# Patient Record
Sex: Female | Born: 1960 | Race: White | Hispanic: No | Marital: Married | Smoking: Former smoker
Health system: Southern US, Community
[De-identification: ages and names within clinical notes are randomized; demographics above are authoritative.]

## PROBLEM LIST (undated history)

## (undated) DIAGNOSIS — J329 Chronic sinusitis, unspecified: Secondary | ICD-10-CM

## (undated) DIAGNOSIS — M76899 Other specified enthesopathies of unspecified lower limb, excluding foot: Secondary | ICD-10-CM

## (undated) DIAGNOSIS — N6019 Diffuse cystic mastopathy of unspecified breast: Secondary | ICD-10-CM

## (undated) DIAGNOSIS — IMO0002 Reserved for concepts with insufficient information to code with codable children: Secondary | ICD-10-CM

## (undated) DIAGNOSIS — M51369 Other intervertebral disc degeneration, lumbar region without mention of lumbar back pain or lower extremity pain: Secondary | ICD-10-CM

## (undated) DIAGNOSIS — F419 Anxiety disorder, unspecified: Secondary | ICD-10-CM

## (undated) DIAGNOSIS — F329 Major depressive disorder, single episode, unspecified: Secondary | ICD-10-CM

## (undated) DIAGNOSIS — N951 Menopausal and female climacteric states: Secondary | ICD-10-CM

## (undated) DIAGNOSIS — M5136 Other intervertebral disc degeneration, lumbar region: Secondary | ICD-10-CM

## (undated) DIAGNOSIS — F32A Depression, unspecified: Secondary | ICD-10-CM

## (undated) DIAGNOSIS — T7840XA Allergy, unspecified, initial encounter: Secondary | ICD-10-CM

## (undated) DIAGNOSIS — F410 Panic disorder [episodic paroxysmal anxiety] without agoraphobia: Secondary | ICD-10-CM

## (undated) DIAGNOSIS — E785 Hyperlipidemia, unspecified: Secondary | ICD-10-CM

## (undated) DIAGNOSIS — M1990 Unspecified osteoarthritis, unspecified site: Secondary | ICD-10-CM

## (undated) DIAGNOSIS — G43909 Migraine, unspecified, not intractable, without status migrainosus: Secondary | ICD-10-CM

## (undated) DIAGNOSIS — K219 Gastro-esophageal reflux disease without esophagitis: Secondary | ICD-10-CM

## (undated) DIAGNOSIS — M19049 Primary osteoarthritis, unspecified hand: Secondary | ICD-10-CM

## (undated) DIAGNOSIS — C801 Malignant (primary) neoplasm, unspecified: Secondary | ICD-10-CM

## (undated) DIAGNOSIS — M5030 Other cervical disc degeneration, unspecified cervical region: Secondary | ICD-10-CM

## (undated) DIAGNOSIS — G8929 Other chronic pain: Secondary | ICD-10-CM

## (undated) DIAGNOSIS — G5600 Carpal tunnel syndrome, unspecified upper limb: Secondary | ICD-10-CM

## (undated) DIAGNOSIS — T23269A Burn of second degree of back of unspecified hand, initial encounter: Secondary | ICD-10-CM

## (undated) HISTORY — DX: Unspecified osteoarthritis, unspecified site: M19.90

## (undated) HISTORY — DX: Panic disorder (episodic paroxysmal anxiety): F41.0

## (undated) HISTORY — DX: Carpal tunnel syndrome, unspecified upper limb: G56.00

## (undated) HISTORY — DX: Anxiety disorder, unspecified: F41.9

## (undated) HISTORY — DX: Migraine, unspecified, not intractable, without status migrainosus: G43.909

## (undated) HISTORY — DX: Other intervertebral disc degeneration, lumbar region without mention of lumbar back pain or lower extremity pain: M51.369

## (undated) HISTORY — DX: Other specified enthesopathies of unspecified lower limb, excluding foot: M76.899

## (undated) HISTORY — DX: Major depressive disorder, single episode, unspecified: F32.9

## (undated) HISTORY — DX: Burn of second degree of back of unspecified hand, initial encounter: T23.269A

## (undated) HISTORY — DX: Other chronic pain: G89.29

## (undated) HISTORY — DX: Other intervertebral disc degeneration, lumbar region: M51.36

## (undated) HISTORY — PX: OTHER SURGICAL HISTORY: SHX169

## (undated) HISTORY — DX: Depression, unspecified: F32.A

## (undated) HISTORY — PX: ABDOMINAL HYSTERECTOMY: SHX81

## (undated) HISTORY — DX: Other cervical disc degeneration, unspecified cervical region: M50.30

## (undated) HISTORY — DX: Malignant (primary) neoplasm, unspecified: C80.1

## (undated) HISTORY — DX: Gastro-esophageal reflux disease without esophagitis: K21.9

## (undated) HISTORY — DX: Allergy, unspecified, initial encounter: T78.40XA

## (undated) HISTORY — DX: Primary osteoarthritis, unspecified hand: M19.049

## (undated) HISTORY — DX: Hyperlipidemia, unspecified: E78.5

## (undated) HISTORY — DX: Diffuse cystic mastopathy of unspecified breast: N60.19

## (undated) HISTORY — DX: Menopausal and female climacteric states: N95.1

## (undated) HISTORY — DX: Reserved for concepts with insufficient information to code with codable children: IMO0002

## (undated) HISTORY — DX: Chronic sinusitis, unspecified: J32.9

---

## 2006-11-04 ENCOUNTER — Ambulatory Visit: Payer: Self-pay

## 2007-06-05 ENCOUNTER — Other Ambulatory Visit: Payer: Self-pay

## 2007-06-05 ENCOUNTER — Emergency Department: Payer: Self-pay | Admitting: Emergency Medicine

## 2007-06-16 ENCOUNTER — Ambulatory Visit: Payer: Self-pay

## 2007-07-07 ENCOUNTER — Encounter: Payer: Self-pay | Admitting: Specialist

## 2007-07-15 ENCOUNTER — Encounter: Payer: Self-pay | Admitting: Specialist

## 2007-08-12 ENCOUNTER — Ambulatory Visit: Payer: Self-pay | Admitting: Specialist

## 2007-08-15 ENCOUNTER — Encounter: Payer: Self-pay | Admitting: Specialist

## 2007-09-15 ENCOUNTER — Encounter: Payer: Self-pay | Admitting: Specialist

## 2008-08-24 ENCOUNTER — Ambulatory Visit: Payer: Self-pay | Admitting: Family Medicine

## 2009-10-25 ENCOUNTER — Ambulatory Visit: Payer: Self-pay | Admitting: Family Medicine

## 2010-01-14 HISTORY — PX: SKIN GRAFT: SHX250

## 2010-01-14 HISTORY — PX: ESCHAROTOMY: SHX248

## 2011-09-20 ENCOUNTER — Ambulatory Visit (INDEPENDENT_AMBULATORY_CARE_PROVIDER_SITE_OTHER): Payer: BC Managed Care – PPO | Admitting: Family Medicine

## 2011-09-20 ENCOUNTER — Ambulatory Visit: Payer: BC Managed Care – PPO

## 2011-09-20 VITALS — BP 126/88 | HR 85 | Temp 98.3°F | Resp 16 | Ht 62.75 in | Wt 201.8 lb

## 2011-09-20 DIAGNOSIS — K219 Gastro-esophageal reflux disease without esophagitis: Secondary | ICD-10-CM

## 2011-09-20 DIAGNOSIS — Z23 Encounter for immunization: Secondary | ICD-10-CM

## 2011-09-20 DIAGNOSIS — R202 Paresthesia of skin: Secondary | ICD-10-CM

## 2011-09-20 DIAGNOSIS — M5136 Other intervertebral disc degeneration, lumbar region: Secondary | ICD-10-CM

## 2011-09-20 DIAGNOSIS — R0602 Shortness of breath: Secondary | ICD-10-CM

## 2011-09-20 DIAGNOSIS — G894 Chronic pain syndrome: Secondary | ICD-10-CM | POA: Insufficient documentation

## 2011-09-20 DIAGNOSIS — F419 Anxiety disorder, unspecified: Secondary | ICD-10-CM | POA: Insufficient documentation

## 2011-09-20 DIAGNOSIS — G5603 Carpal tunnel syndrome, bilateral upper limbs: Secondary | ICD-10-CM | POA: Insufficient documentation

## 2011-09-20 DIAGNOSIS — E785 Hyperlipidemia, unspecified: Secondary | ICD-10-CM | POA: Insufficient documentation

## 2011-09-20 DIAGNOSIS — F411 Generalized anxiety disorder: Secondary | ICD-10-CM

## 2011-09-20 DIAGNOSIS — M5030 Other cervical disc degeneration, unspecified cervical region: Secondary | ICD-10-CM

## 2011-09-20 DIAGNOSIS — M5137 Other intervertebral disc degeneration, lumbosacral region: Secondary | ICD-10-CM

## 2011-09-20 DIAGNOSIS — M51369 Other intervertebral disc degeneration, lumbar region without mention of lumbar back pain or lower extremity pain: Secondary | ICD-10-CM | POA: Insufficient documentation

## 2011-09-20 LAB — COMPREHENSIVE METABOLIC PANEL
Albumin: 4.3 g/dL (ref 3.5–5.2)
CO2: 29 mEq/L (ref 19–32)
Glucose, Bld: 86 mg/dL (ref 70–99)
Potassium: 4.2 mEq/L (ref 3.5–5.3)
Sodium: 140 mEq/L (ref 135–145)
Total Bilirubin: 0.2 mg/dL — ABNORMAL LOW (ref 0.3–1.2)
Total Protein: 6.9 g/dL (ref 6.0–8.3)

## 2011-09-20 LAB — POCT CBC
HCT, POC: 39 % (ref 37.7–47.9)
Hemoglobin: 12.3 g/dL (ref 12.2–16.2)
Lymph, poc: 3.1 (ref 0.6–3.4)
MCHC: 31.5 g/dL — AB (ref 31.8–35.4)
POC Granulocyte: 3.9 (ref 2–6.9)

## 2011-09-20 LAB — TSH: TSH: 1.923 u[IU]/mL (ref 0.350–4.500)

## 2011-09-20 MED ORDER — OXYCODONE HCL 5 MG PO TABS: 5.0000 mg | ORAL_TABLET | Freq: Three times a day (TID) | ORAL | Status: DC | PRN

## 2011-09-20 MED ORDER — ALPRAZOLAM 1 MG PO TABS: 1.0000 mg | ORAL_TABLET | Freq: Three times a day (TID) | ORAL | Status: DC | PRN

## 2011-09-20 MED ORDER — OXYCODONE HCL 5 MG PO TABS: 5.0000 mg | ORAL_TABLET | Freq: Three times a day (TID) | ORAL | Status: AC | PRN

## 2011-09-20 MED ORDER — OMEPRAZOLE 40 MG PO CPDR: 40.0000 mg | DELAYED_RELEASE_CAPSULE | Freq: Every day | ORAL | Status: DC
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
## 2011-09-20 NOTE — Patient Instructions (Addendum)
1. Degenerative disc disease, cervical    2. Lumbar degenerative disc disease    3. Paresthesias in right hand  POCT CBC, Comprehensive metabolic panel, TSH  4. GERD (gastroesophageal reflux disease)    5. Anxiety    6. Hyperlipidemia    7. Shortness of breath  EKG 12-Lead, DG Chest 2 View     FOLLOW UP IN 3 MONTHS.

## 2011-09-21 NOTE — Progress Notes (Signed)
Reviewed and agree.

## 2011-09-23 ENCOUNTER — Telehealth: Payer: Self-pay

## 2011-09-23 MED ORDER — OMEPRAZOLE 40 MG PO CPDR: 40.0000 mg | DELAYED_RELEASE_CAPSULE | Freq: Every day | ORAL | Status: DC

## 2011-09-23 NOTE — Telephone Encounter (Signed)
nexium is $50 at [facility], she wants sent to [facility] instead will be cheaper, done.

## 2011-09-23 NOTE — Telephone Encounter (Signed)
Pt would like her acid meds called into [facility] in [place], [place] Dr.. Katrinka Blazing

## 2011-09-30 HISTORY — PX: OTHER SURGICAL HISTORY: SHX169

## 2011-10-01 ENCOUNTER — Telehealth: Payer: Self-pay

## 2011-10-01 MED ORDER — OMEPRAZOLE 40 MG PO CPDR: 40.0000 mg | DELAYED_RELEASE_CAPSULE | Freq: Every day | ORAL | Status: DC

## 2011-10-01 NOTE — Telephone Encounter (Signed)
rx sent in again. lmom that rx was sent in

## 2011-10-01 NOTE — Telephone Encounter (Signed)
Pt states she has been trying to get rx for generic prilosec filled, but [facility] @ [address], states has not been sent in by [facility] . Please call pt to advise.

## 2011-10-14 ENCOUNTER — Ambulatory Visit: Payer: Self-pay | Admitting: Family Medicine

## 2011-11-15 HISTORY — PX: OTHER SURGICAL HISTORY: SHX169

## 2011-12-05 ENCOUNTER — Encounter: Payer: Self-pay | Admitting: *Deleted

## 2011-12-24 ENCOUNTER — Encounter: Payer: Self-pay | Admitting: Family Medicine

## 2011-12-24 ENCOUNTER — Ambulatory Visit (INDEPENDENT_AMBULATORY_CARE_PROVIDER_SITE_OTHER): Payer: BC Managed Care – PPO | Admitting: Family Medicine

## 2011-12-24 VITALS — BP 113/65 | HR 87 | Temp 98.8°F | Resp 16 | Ht 63.5 in | Wt 208.0 lb

## 2011-12-24 DIAGNOSIS — G894 Chronic pain syndrome: Secondary | ICD-10-CM

## 2011-12-24 DIAGNOSIS — F329 Major depressive disorder, single episode, unspecified: Secondary | ICD-10-CM

## 2011-12-24 DIAGNOSIS — M5136 Other intervertebral disc degeneration, lumbar region: Secondary | ICD-10-CM

## 2011-12-24 DIAGNOSIS — M51369 Other intervertebral disc degeneration, lumbar region without mention of lumbar back pain or lower extremity pain: Secondary | ICD-10-CM

## 2011-12-24 DIAGNOSIS — M5137 Other intervertebral disc degeneration, lumbosacral region: Secondary | ICD-10-CM

## 2011-12-24 DIAGNOSIS — F419 Anxiety disorder, unspecified: Secondary | ICD-10-CM | POA: Insufficient documentation

## 2011-12-24 DIAGNOSIS — F32A Depression, unspecified: Secondary | ICD-10-CM | POA: Insufficient documentation

## 2011-12-24 DIAGNOSIS — K219 Gastro-esophageal reflux disease without esophagitis: Secondary | ICD-10-CM

## 2011-12-24 DIAGNOSIS — E7800 Pure hypercholesterolemia, unspecified: Secondary | ICD-10-CM

## 2011-12-24 DIAGNOSIS — F341 Dysthymic disorder: Secondary | ICD-10-CM

## 2011-12-24 DIAGNOSIS — M5030 Other cervical disc degeneration, unspecified cervical region: Secondary | ICD-10-CM

## 2011-12-24 LAB — COMPREHENSIVE METABOLIC PANEL
AST: 26 U/L (ref 0–37)
Albumin: 4.3 g/dL (ref 3.5–5.2)
Alkaline Phosphatase: 82 U/L (ref 39–117)
BUN: 21 mg/dL (ref 6–23)
Creat: 0.76 mg/dL (ref 0.50–1.10)
Potassium: 4.4 mEq/L (ref 3.5–5.3)
Total Bilirubin: 0.3 mg/dL (ref 0.3–1.2)

## 2011-12-24 LAB — CBC WITH DIFFERENTIAL/PLATELET
Basophils Absolute: 0 10*3/uL (ref 0.0–0.1)
Basophils Relative: 0 % (ref 0–1)
Eosinophils Absolute: 0.3 10*3/uL (ref 0.0–0.7)
HCT: 36.5 % (ref 36.0–46.0)
MCHC: 34.5 g/dL (ref 30.0–36.0)
Monocytes Absolute: 0.7 10*3/uL (ref 0.1–1.0)
Neutro Abs: 3.7 10*3/uL (ref 1.7–7.7)
RDW: 13.9 % (ref 11.5–15.5)

## 2011-12-24 LAB — LIPID PANEL
HDL: 54 mg/dL (ref >39)
LDL Cholesterol: 115 mg/dL — ABNORMAL HIGH (ref 0–99)
Total CHOL/HDL Ratio: 3.7 Ratio
VLDL: 29 mg/dL (ref 0–40)

## 2011-12-24 LAB — CK: Total CK: 123 U/L (ref 7–177)

## 2011-12-24 MED ORDER — OXYCODONE-ACETAMINOPHEN 5-325 MG PO TABS: 1.0000 | ORAL_TABLET | Freq: Four times a day (QID) | ORAL | Status: DC | PRN

## 2011-12-24 MED ORDER — ALPRAZOLAM 1 MG PO TABS: 1.0000 mg | ORAL_TABLET | Freq: Three times a day (TID) | ORAL | Status: DC | PRN
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
## 2011-12-24 NOTE — Assessment & Plan Note (Signed)
Persistent.  Follow-up with pain specialist in 02/2012.  Will not increase Percocet further; if needs further pain management, will warrant management by Pain Specialist. Encourage trial of steroid injections.

## 2011-12-24 NOTE — Assessment & Plan Note (Signed)
Moderately controlled; obtain labs; continue current medication.

## 2011-12-24 NOTE — Patient Instructions (Addendum)
1. Pure hypercholesterolemia  CBC with Differential, Comprehensive metabolic panel, Lipid panel, CK  2. Degenerative cervical disc    3. DDD (degenerative disc disease), lumbar    4. Anxiety and depression    5. GERD (gastroesophageal reflux disease)

## 2011-12-24 NOTE — Assessment & Plan Note (Signed)
Persistent.  Undergoing PT and pain management evaluation currently.  Encourage trial of steroid injections.

## 2011-12-24 NOTE — Assessment & Plan Note (Signed)
Persistent.  Undergoing PT and evaluation by pain management.  Encourage steroid injections.  Agreeable to increasing Percocet to qid but will not increase further; encourage further pain management by pain specialist.  To discuss pain management with Pain Specialist in 02/2012.

## 2011-12-24 NOTE — Assessment & Plan Note (Signed)
Persistent but stable; counseling provided in office.  Refill of Xanax tid for three months.

## 2011-12-28 ENCOUNTER — Encounter: Payer: Self-pay | Admitting: Radiology

## 2012-01-23 ENCOUNTER — Telehealth: Payer: Self-pay | Admitting: *Deleted

## 2012-01-23 MED ORDER — MELOXICAM 15 MG PO TABS: 15.0000 mg | ORAL_TABLET | Freq: Every day | ORAL | Status: DC

## 2012-01-23 NOTE — Telephone Encounter (Signed)
Sent to pharamcy

## 2012-01-23 NOTE — Telephone Encounter (Signed)
Pharmacy requesting refill on meloxicam 15mg .  Last filled on 12/16/11

## 2012-01-24 ENCOUNTER — Telehealth: Payer: Self-pay

## 2012-01-24 NOTE — Telephone Encounter (Signed)
This was sent for her yesterday. I have called patient to advise. Left message for her.

## 2012-01-24 NOTE — Telephone Encounter (Signed)
Patient would like refill on her meloxicam the pharmacy has faxed [place] the request but they have not heard back.

## 2012-01-28 ENCOUNTER — Other Ambulatory Visit: Payer: Self-pay | Admitting: Family Medicine

## 2012-02-24 ENCOUNTER — Telehealth: Payer: Self-pay

## 2012-02-24 MED ORDER — MELOXICAM 15 MG PO TABS: 15.0000 mg | ORAL_TABLET | Freq: Every day | ORAL | Status: DC

## 2012-02-24 NOTE — Telephone Encounter (Signed)
Signed.  Pharmacy needs to send electronic requests

## 2012-02-24 NOTE — Telephone Encounter (Signed)
Pended please advise.  

## 2012-02-24 NOTE — Telephone Encounter (Signed)
Pt is out of meloxicam (MOBIC) 15 MG tablet Pharmacy sent faxes four days now, according to patient  [phone]

## 2012-02-25 ENCOUNTER — Encounter: Payer: Self-pay | Admitting: Family Medicine

## 2012-02-25 NOTE — Telephone Encounter (Signed)
Patient advised.

## 2012-03-04 ENCOUNTER — Other Ambulatory Visit: Payer: Self-pay | Admitting: Physician Assistant

## 2012-03-06 ENCOUNTER — Telehealth: Payer: Self-pay

## 2012-03-06 NOTE — Telephone Encounter (Signed)
Patient says pharmacy has called several times to refill gabapentin without response. According to chart we sent it in 02/19 so I informed patient and she is calling pharmacy to double check.

## 2012-03-30 ENCOUNTER — Ambulatory Visit (INDEPENDENT_AMBULATORY_CARE_PROVIDER_SITE_OTHER): Payer: BC Managed Care – PPO | Admitting: Family Medicine

## 2012-03-30 ENCOUNTER — Encounter: Payer: Self-pay | Admitting: Family Medicine

## 2012-03-30 ENCOUNTER — Ambulatory Visit: Payer: BC Managed Care – PPO | Admitting: Family Medicine

## 2012-03-30 VITALS — BP 128/100 | HR 90 | Temp 98.6°F | Resp 16 | Ht 63.0 in | Wt 215.0 lb

## 2012-03-30 DIAGNOSIS — F329 Major depressive disorder, single episode, unspecified: Secondary | ICD-10-CM

## 2012-03-30 DIAGNOSIS — M5030 Other cervical disc degeneration, unspecified cervical region: Secondary | ICD-10-CM

## 2012-03-30 DIAGNOSIS — E7800 Pure hypercholesterolemia, unspecified: Secondary | ICD-10-CM

## 2012-03-30 DIAGNOSIS — F341 Dysthymic disorder: Secondary | ICD-10-CM

## 2012-03-30 DIAGNOSIS — K219 Gastro-esophageal reflux disease without esophagitis: Secondary | ICD-10-CM

## 2012-03-30 DIAGNOSIS — M5136 Other intervertebral disc degeneration, lumbar region: Secondary | ICD-10-CM

## 2012-03-30 DIAGNOSIS — G5603 Carpal tunnel syndrome, bilateral upper limbs: Secondary | ICD-10-CM

## 2012-03-30 DIAGNOSIS — R079 Chest pain, unspecified: Secondary | ICD-10-CM

## 2012-03-30 DIAGNOSIS — G894 Chronic pain syndrome: Secondary | ICD-10-CM

## 2012-03-30 LAB — COMPREHENSIVE METABOLIC PANEL
AST: 40 U/L — ABNORMAL HIGH (ref 0–37)
Alkaline Phosphatase: 79 U/L (ref 39–117)
Glucose, Bld: 138 mg/dL — ABNORMAL HIGH (ref 70–99)
Sodium: 139 mEq/L (ref 135–145)
Total Bilirubin: 0.4 mg/dL (ref 0.3–1.2)
Total Protein: 7.1 g/dL (ref 6.0–8.3)

## 2012-03-30 LAB — CBC WITH DIFFERENTIAL/PLATELET
Basophils Absolute: 0 10*3/uL (ref 0.0–0.1)
Basophils Relative: 0 % (ref 0–1)
Lymphocytes Relative: 44 % (ref 12–46)
MCHC: 34.6 g/dL (ref 30.0–36.0)
Monocytes Absolute: 0.3 10*3/uL (ref 0.1–1.0)
Neutro Abs: 2.1 10*3/uL (ref 1.7–7.7)
Neutrophils Relative %: 45 % (ref 43–77)
Platelets: 223 10*3/uL (ref 150–400)
RDW: 13.5 % (ref 11.5–15.5)
WBC: 4.8 10*3/uL (ref 4.0–10.5)

## 2012-03-30 LAB — LIPID PANEL
HDL: 44 mg/dL (ref >39)
LDL Cholesterol: 104 mg/dL — ABNORMAL HIGH (ref 0–99)
Triglycerides: 214 mg/dL — ABNORMAL HIGH (ref <150)
VLDL: 43 mg/dL — ABNORMAL HIGH (ref 0–40)

## 2012-03-30 LAB — CK: Total CK: 101 U/L (ref 7–177)

## 2012-03-30 MED ORDER — OXYCODONE-ACETAMINOPHEN 5-325 MG PO TABS: 1.0000 | ORAL_TABLET | Freq: Four times a day (QID) | ORAL | Status: DC | PRN

## 2012-03-30 MED ORDER — NORTRIPTYLINE HCL 25 MG PO CAPS: ORAL_CAPSULE | ORAL | Status: DC

## 2012-03-30 MED ORDER — GABAPENTIN 800 MG PO TABS: 800.0000 mg | ORAL_TABLET | Freq: Three times a day (TID) | ORAL | Status: DC

## 2012-03-30 MED ORDER — FLUTICASONE PROPIONATE 50 MCG/ACT NA SUSP
2.0000 | Freq: Every day | NASAL | Status: DC
Start: 2012-03-30 — End: 2013-01-31

## 2012-03-30 MED ORDER — ATORVASTATIN CALCIUM 10 MG PO TABS: 10.0000 mg | ORAL_TABLET | Freq: Every day | ORAL | Status: DC

## 2012-03-30 MED ORDER — MELOXICAM 15 MG PO TABS: 15.0000 mg | ORAL_TABLET | Freq: Every day | ORAL | Status: DC

## 2012-03-30 MED ORDER — OMEPRAZOLE 40 MG PO CPDR: 40.0000 mg | DELAYED_RELEASE_CAPSULE | Freq: Every day | ORAL | Status: DC

## 2012-03-30 MED ORDER — AZELASTINE HCL 0.1 % NA SOLN: 1.0000 | Freq: Two times a day (BID) | NASAL | Status: DC

## 2012-03-30 MED ORDER — LEVOCETIRIZINE DIHYDROCHLORIDE 5 MG PO TABS: 5.0000 mg | ORAL_TABLET | Freq: Every evening | ORAL | Status: DC

## 2012-03-30 MED ORDER — ALPRAZOLAM 1 MG PO TABS: 1.0000 mg | ORAL_TABLET | Freq: Three times a day (TID) | ORAL | Status: DC | PRN

## 2012-03-30 MED ORDER — CITALOPRAM HYDROBROMIDE 40 MG PO TABS: 40.0000 mg | ORAL_TABLET | Freq: Every day | ORAL | Status: DC

## 2012-03-30 NOTE — Assessment & Plan Note (Signed)
Stable; refill of Xanax provided; continue current dose of Citalopram.
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
## 2012-03-30 NOTE — Patient Instructions (Addendum)
Pure hypercholesterolemia - Plan: CBC with Differential, CK, Comprehensive metabolic panel, Lipid panel  Degenerative cervical disc  Lumbar degenerative disc disease  GERD (gastroesophageal reflux disease)  Anxiety and depression  Chronic pain syndrome  Chest pain - Plan: EKG 12-Lead

## 2012-03-30 NOTE — Assessment & Plan Note (Signed)
Controlled; no change in management.  Refill provided.  Obtain labs.

## 2012-03-30 NOTE — Assessment & Plan Note (Signed)
Worsening; scheduled for injections this week.  Refill of Percocet provided.

## 2012-03-30 NOTE — Assessment & Plan Note (Signed)
Stable; did not discuss pain management with pain specialist; obtain records. Refill of Mobic, Gabapentin, Percocet provided. Also refilled Nortriptyline.

## 2012-03-30 NOTE — Assessment & Plan Note (Signed)
Persistent; scheduled for injection this week.  Refill of Percocet 5/325 one po qid x 3 months provided.  Pt did not discuss further pain management with Pain Specialist.  Will obtain records.

## 2012-03-30 NOTE — Assessment & Plan Note (Signed)
New.  Atypical in nature.  Pain free today; EKG normal and stable.  If recurs, advised to present to ED.

## 2012-03-30 NOTE — Assessment & Plan Note (Signed)
Stable; no further treatment warranted at this time.

## 2012-04-03 ENCOUNTER — Telehealth: Payer: Self-pay | Admitting: Radiology

## 2012-04-03 NOTE — Telephone Encounter (Signed)
Patient would like refill of nortripyline she takes at bedtime. However this was already sent in. Vernona Rieger called patient to advise it was sent in to [facility]

## 2012-05-11 ENCOUNTER — Encounter: Payer: Self-pay | Admitting: Family Medicine

## 2012-05-11 ENCOUNTER — Ambulatory Visit (INDEPENDENT_AMBULATORY_CARE_PROVIDER_SITE_OTHER): Payer: BC Managed Care – PPO | Admitting: Family Medicine

## 2012-05-11 VITALS — BP 127/90 | HR 98 | Temp 98.8°F | Resp 18 | Wt 213.0 lb

## 2012-05-11 DIAGNOSIS — F341 Dysthymic disorder: Secondary | ICD-10-CM

## 2012-05-11 DIAGNOSIS — B3789 Other sites of candidiasis: Secondary | ICD-10-CM

## 2012-05-11 DIAGNOSIS — F329 Major depressive disorder, single episode, unspecified: Secondary | ICD-10-CM

## 2012-05-11 MED ORDER — FLUCONAZOLE 150 MG PO TABS: 150.0000 mg | ORAL_TABLET | Freq: Once | ORAL | Status: DC

## 2012-05-11 MED ORDER — NYSTATIN 100000 UNIT/GM EX CREA: TOPICAL_CREAM | Freq: Two times a day (BID) | CUTANEOUS | Status: DC
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
## 2012-06-10 ENCOUNTER — Encounter: Payer: Self-pay | Admitting: Allergy and Immunology

## 2012-07-07 ENCOUNTER — Encounter: Payer: Self-pay | Admitting: Family Medicine

## 2012-07-07 ENCOUNTER — Ambulatory Visit (INDEPENDENT_AMBULATORY_CARE_PROVIDER_SITE_OTHER): Payer: BC Managed Care – PPO | Admitting: Family Medicine

## 2012-07-07 ENCOUNTER — Other Ambulatory Visit: Payer: Self-pay | Admitting: Family Medicine

## 2012-07-07 VITALS — BP 128/90 | HR 91 | Temp 99.1°F | Resp 16 | Ht 62.5 in | Wt 214.0 lb

## 2012-07-07 DIAGNOSIS — M5030 Other cervical disc degeneration, unspecified cervical region: Secondary | ICD-10-CM

## 2012-07-07 DIAGNOSIS — F419 Anxiety disorder, unspecified: Secondary | ICD-10-CM

## 2012-07-07 DIAGNOSIS — Z8542 Personal history of malignant neoplasm of other parts of uterus: Secondary | ICD-10-CM

## 2012-07-07 DIAGNOSIS — Z01419 Encounter for gynecological examination (general) (routine) without abnormal findings: Secondary | ICD-10-CM

## 2012-07-07 DIAGNOSIS — M5136 Other intervertebral disc degeneration, lumbar region: Secondary | ICD-10-CM

## 2012-07-07 DIAGNOSIS — G894 Chronic pain syndrome: Secondary | ICD-10-CM

## 2012-07-07 DIAGNOSIS — Z0000 Encounter for general adult medical examination without abnormal findings: Secondary | ICD-10-CM

## 2012-07-07 LAB — CBC
MCH: 29.1 pg (ref 26.0–34.0)
MCV: 86.8 fL (ref 78.0–100.0)
Platelets: 232 10*3/uL (ref 150–400)
RDW: 13.3 % (ref 11.5–15.5)

## 2012-07-07 LAB — POCT URINALYSIS DIPSTICK
Protein, UA: NEGATIVE
Spec Grav, UA: 1.015
Urobilinogen, UA: 0.2
pH, UA: 5

## 2012-07-07 LAB — LIPID PANEL
Cholesterol: 197 mg/dL (ref 0–200)
Total CHOL/HDL Ratio: 3.5 Ratio
Triglycerides: 159 mg/dL — ABNORMAL HIGH (ref <150)
VLDL: 32 mg/dL (ref 0–40)

## 2012-07-07 LAB — VITAMIN B12: Vitamin B-12: 557 pg/mL (ref 211–911)

## 2012-07-07 LAB — COMPREHENSIVE METABOLIC PANEL
ALT: 71 U/L — ABNORMAL HIGH (ref 0–35)
AST: 41 U/L — ABNORMAL HIGH (ref 0–37)
Creat: 0.88 mg/dL (ref 0.50–1.10)
Total Bilirubin: 0.3 mg/dL (ref 0.3–1.2)

## 2012-07-07 LAB — IFOBT (OCCULT BLOOD): IFOBT: NEGATIVE

## 2012-07-07 LAB — TSH: TSH: 1.752 u[IU]/mL (ref 0.350–4.500)

## 2012-07-07 MED ORDER — OXYCODONE-ACETAMINOPHEN 5-325 MG PO TABS: 1.0000 | ORAL_TABLET | Freq: Four times a day (QID) | ORAL | Status: DC | PRN

## 2012-07-07 MED ORDER — ALPRAZOLAM 1 MG PO TABS: 1.0000 mg | ORAL_TABLET | Freq: Three times a day (TID) | ORAL | Status: DC | PRN
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
## 2012-07-07 NOTE — Progress Notes (Signed)
  Subjective:    Patient ID: Jeanette Thomas, female    DOB: 1960/05/25, 52 y.o.   MRN: [id number]  HPI    Review of Systems  HENT: Positive for congestion, neck pain and sinus pressure.   Eyes: Negative.   Respiratory: Negative.   Cardiovascular: Negative.   Gastrointestinal: Negative.   Endocrine: Negative.   Genitourinary: Negative.   Skin: Negative.   Allergic/Immunologic: Negative.   Neurological: Negative.   Hematological: Negative.   Psychiatric/Behavioral: The patient is nervous/anxious.        Objective:   Physical Exam        Assessment & Plan:

## 2012-07-08 LAB — PAP IG (IMAGE GUIDED)

## 2012-07-10 LAB — HEPATITIS PANEL, ACUTE: HCV Ab: NEGATIVE

## 2012-08-14 DIAGNOSIS — Z0000 Encounter for general adult medical examination without abnormal findings: Secondary | ICD-10-CM | POA: Insufficient documentation

## 2012-08-14 DIAGNOSIS — Z01419 Encounter for gynecological examination (general) (routine) without abnormal findings: Secondary | ICD-10-CM | POA: Insufficient documentation

## 2012-08-14 DIAGNOSIS — Z8542 Personal history of malignant neoplasm of other parts of uterus: Secondary | ICD-10-CM | POA: Insufficient documentation

## 2012-08-18 ENCOUNTER — Ambulatory Visit: Payer: Self-pay | Admitting: Family Medicine

## 2012-09-08 ENCOUNTER — Telehealth: Payer: Self-pay

## 2012-09-08 NOTE — Telephone Encounter (Signed)
Left message on machine to call back.  Need to advise patient that mammogram from 08/18/2012 was normal.

## 2012-09-10 NOTE — Telephone Encounter (Signed)
Letter sent.

## 2012-10-12 ENCOUNTER — Other Ambulatory Visit: Payer: Self-pay

## 2012-10-12 ENCOUNTER — Encounter: Payer: Self-pay | Admitting: Family Medicine

## 2012-10-12 MED ORDER — NORTRIPTYLINE HCL 25 MG PO CAPS
ORAL_CAPSULE | ORAL | Status: DC
Start: 2012-10-12 — End: 2013-01-31

## 2012-10-13 ENCOUNTER — Ambulatory Visit (INDEPENDENT_AMBULATORY_CARE_PROVIDER_SITE_OTHER): Payer: BC Managed Care – PPO | Admitting: Family Medicine

## 2012-10-13 ENCOUNTER — Encounter: Payer: Self-pay | Admitting: Family Medicine

## 2012-10-13 VITALS — BP 120/92 | HR 107 | Temp 98.8°F | Resp 18 | Ht 64.0 in | Wt 219.2 lb

## 2012-10-13 DIAGNOSIS — M5137 Other intervertebral disc degeneration, lumbosacral region: Secondary | ICD-10-CM

## 2012-10-13 DIAGNOSIS — M5030 Other cervical disc degeneration, unspecified cervical region: Secondary | ICD-10-CM

## 2012-10-13 DIAGNOSIS — R945 Abnormal results of liver function studies: Secondary | ICD-10-CM

## 2012-10-13 DIAGNOSIS — F341 Dysthymic disorder: Secondary | ICD-10-CM

## 2012-10-13 DIAGNOSIS — R7309 Other abnormal glucose: Secondary | ICD-10-CM

## 2012-10-13 DIAGNOSIS — Z23 Encounter for immunization: Secondary | ICD-10-CM

## 2012-10-13 DIAGNOSIS — M5136 Other intervertebral disc degeneration, lumbar region: Secondary | ICD-10-CM

## 2012-10-13 DIAGNOSIS — G894 Chronic pain syndrome: Secondary | ICD-10-CM

## 2012-10-13 DIAGNOSIS — F329 Major depressive disorder, single episode, unspecified: Secondary | ICD-10-CM

## 2012-10-13 DIAGNOSIS — K769 Liver disease, unspecified: Secondary | ICD-10-CM

## 2012-10-13 DIAGNOSIS — R7302 Impaired glucose tolerance (oral): Secondary | ICD-10-CM

## 2012-10-13 LAB — HEMOGLOBIN A1C: Hgb A1c MFr Bld: 7.2 % — ABNORMAL HIGH (ref <5.7)

## 2012-10-13 LAB — COMPREHENSIVE METABOLIC PANEL
ALT: 74 U/L — ABNORMAL HIGH (ref 0–35)
Albumin: 4.1 g/dL (ref 3.5–5.2)
CO2: 28 mEq/L (ref 19–32)
Calcium: 9.2 mg/dL (ref 8.4–10.5)
Chloride: 101 mEq/L (ref 96–112)
Sodium: 138 mEq/L (ref 135–145)
Total Protein: 7 g/dL (ref 6.0–8.3)

## 2012-10-13 MED ORDER — OXYCODONE-ACETAMINOPHEN 5-325 MG PO TABS: 1.0000 | ORAL_TABLET | Freq: Four times a day (QID) | ORAL | Status: DC | PRN

## 2012-10-13 MED ORDER — ALPRAZOLAM 1 MG PO TABS: 1.0000 mg | ORAL_TABLET | Freq: Three times a day (TID) | ORAL | Status: DC | PRN
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
## 2012-12-30 ENCOUNTER — Other Ambulatory Visit: Payer: Self-pay

## 2012-12-30 MED ORDER — CITALOPRAM HYDROBROMIDE 40 MG PO TABS: 40.0000 mg | ORAL_TABLET | Freq: Every day | ORAL | Status: DC

## 2013-01-25 ENCOUNTER — Ambulatory Visit: Payer: BC Managed Care – PPO | Admitting: Family Medicine

## 2013-01-31 ENCOUNTER — Ambulatory Visit: Payer: BC Managed Care – PPO

## 2013-01-31 ENCOUNTER — Ambulatory Visit (INDEPENDENT_AMBULATORY_CARE_PROVIDER_SITE_OTHER): Payer: BC Managed Care – PPO | Admitting: Family Medicine

## 2013-01-31 VITALS — BP 138/90 | HR 101 | Temp 98.1°F | Resp 18 | Ht 63.0 in | Wt 219.2 lb

## 2013-01-31 DIAGNOSIS — E119 Type 2 diabetes mellitus without complications: Secondary | ICD-10-CM

## 2013-01-31 DIAGNOSIS — M25429 Effusion, unspecified elbow: Secondary | ICD-10-CM

## 2013-01-31 DIAGNOSIS — R945 Abnormal results of liver function studies: Secondary | ICD-10-CM

## 2013-01-31 DIAGNOSIS — M7021 Olecranon bursitis, right elbow: Secondary | ICD-10-CM

## 2013-01-31 DIAGNOSIS — F329 Major depressive disorder, single episode, unspecified: Secondary | ICD-10-CM

## 2013-01-31 DIAGNOSIS — M5137 Other intervertebral disc degeneration, lumbosacral region: Secondary | ICD-10-CM

## 2013-01-31 DIAGNOSIS — R7989 Other specified abnormal findings of blood chemistry: Secondary | ICD-10-CM

## 2013-01-31 DIAGNOSIS — K219 Gastro-esophageal reflux disease without esophagitis: Secondary | ICD-10-CM

## 2013-01-31 DIAGNOSIS — M5136 Other intervertebral disc degeneration, lumbar region: Secondary | ICD-10-CM

## 2013-01-31 DIAGNOSIS — M5030 Other cervical disc degeneration, unspecified cervical region: Secondary | ICD-10-CM

## 2013-01-31 DIAGNOSIS — M25529 Pain in unspecified elbow: Principal | ICD-10-CM

## 2013-01-31 DIAGNOSIS — F419 Anxiety disorder, unspecified: Secondary | ICD-10-CM

## 2013-01-31 DIAGNOSIS — E7800 Pure hypercholesterolemia, unspecified: Secondary | ICD-10-CM

## 2013-01-31 DIAGNOSIS — G894 Chronic pain syndrome: Secondary | ICD-10-CM

## 2013-01-31 LAB — POCT CBC
Granulocyte percent: 60.8 %G (ref 37–80)
HCT, POC: 42.8 % (ref 37.7–47.9)
Hemoglobin: 13.2 g/dL (ref 12.2–16.2)
Lymph, poc: 2.6 (ref 0.6–3.4)
MCH: 29.5 pg (ref 27–31.2)
MCHC: 30.8 g/dL — AB (ref 31.8–35.4)
MCV: 95.6 fL (ref 80–97)
MID (CBC): 0.6 (ref 0–0.9)
MPV: 9.5 fL (ref 0–99.8)
PLATELET COUNT, POC: 263 10*3/uL (ref 142–424)
POC Granulocyte: 4.9 (ref 2–6.9)
POC LYMPH %: 31.9 % (ref 10–50)
POC MID %: 7.3 % (ref 0–12)
RBC: 4.48 M/uL (ref 4.04–5.48)
RDW, POC: 14.2 %
WBC: 8 10*3/uL (ref 4.6–10.2)

## 2013-01-31 LAB — COMPREHENSIVE METABOLIC PANEL
ALK PHOS: 93 U/L (ref 39–117)
ALT: 64 U/L — ABNORMAL HIGH (ref 0–35)
AST: 46 U/L — AB (ref 0–37)
Albumin: 4.1 g/dL (ref 3.5–5.2)
BILIRUBIN TOTAL: 0.3 mg/dL (ref 0.3–1.2)
BUN: 18 mg/dL (ref 6–23)
CO2: 26 mEq/L (ref 19–32)
CREATININE: 0.76 mg/dL (ref 0.50–1.10)
Calcium: 8.9 mg/dL (ref 8.4–10.5)
Chloride: 101 mEq/L (ref 96–112)
GLUCOSE: 201 mg/dL — AB (ref 70–99)
POTASSIUM: 4.5 meq/L (ref 3.5–5.3)
Sodium: 139 mEq/L (ref 135–145)
Total Protein: 7 g/dL (ref 6.0–8.3)

## 2013-01-31 LAB — POCT GLYCOSYLATED HEMOGLOBIN (HGB A1C): Hemoglobin A1C: 6.6

## 2013-01-31 MED ORDER — AZELASTINE HCL 0.1 % NA SOLN: 1.0000 | Freq: Two times a day (BID) | NASAL | Status: AC

## 2013-01-31 MED ORDER — ALPRAZOLAM 1 MG PO TABS: 1.0000 mg | ORAL_TABLET | Freq: Three times a day (TID) | ORAL | Status: DC | PRN

## 2013-01-31 MED ORDER — MELOXICAM 15 MG PO TABS: 15.0000 mg | ORAL_TABLET | Freq: Every day | ORAL | Status: AC

## 2013-01-31 MED ORDER — OMEPRAZOLE 40 MG PO CPDR: 40.0000 mg | DELAYED_RELEASE_CAPSULE | Freq: Every day | ORAL | Status: AC

## 2013-01-31 MED ORDER — NORTRIPTYLINE HCL 25 MG PO CAPS: ORAL_CAPSULE | ORAL | Status: DC

## 2013-01-31 MED ORDER — OXYCODONE-ACETAMINOPHEN 5-325 MG PO TABS: 1.0000 | ORAL_TABLET | Freq: Four times a day (QID) | ORAL | Status: DC | PRN

## 2013-01-31 MED ORDER — CITALOPRAM HYDROBROMIDE 40 MG PO TABS: 40.0000 mg | ORAL_TABLET | Freq: Every day | ORAL | Status: AC

## 2013-01-31 MED ORDER — GABAPENTIN 800 MG PO TABS: 800.0000 mg | ORAL_TABLET | Freq: Three times a day (TID) | ORAL | Status: AC

## 2013-01-31 MED ORDER — FLUTICASONE PROPIONATE 50 MCG/ACT NA SUSP: 2.0000 | Freq: Every day | NASAL | Status: AC

## 2013-01-31 MED ORDER — LEVOCETIRIZINE DIHYDROCHLORIDE 5 MG PO TABS: 5.0000 mg | ORAL_TABLET | Freq: Every evening | ORAL | Status: AC
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
## 2013-01-31 NOTE — Patient Instructions (Signed)
Olecranon Bursitis  °with Rehab °A bursa is a fluid filled sac that is located between soft tissues (ligaments, tendons, skin) and bones. The purpose of a bursa is to allow the soft tissue to function smoothly, without friction. The olecrenon bursa is located between the back of the elbow (olecrenon) and the skin. Olecrenon bursitis involves inflammation of this bursa, resulting in pain. °SYMPTOMS  °· Pain, tenderness, swelling, warmth, or redness over the back of the elbow. °· Reduced range of motion of the affected elbow. °· Sometimes, severe pain with movement of the affected elbow. °· Crackling sound (crepitation) when the bursa is moved or touched. °· Often, painless swelling of the bursa. °· Fever (when infected). °CAUSES  °Olecranon bursitis is often caused by direct hit (trauma) to the elbow. Less commonly, it is due to overuse and/or strenuous exercise that the elbow is not used to. °RISK INCREASES WITH: °· Sports that require bending or landing on the elbow (football, volleyball). °· Vigorous or repetitive athletic training, or sudden increase or change in activity level (weekend warriors). °· Failure to warm up properly before activity. °· Poor exercise technique. °· Playing on artificial turf. °PREVENTION °· Avoid injuries and the overuse of muscles whenever possible. °· Warm up and stretch properly before activity. °· Allow for adequate recovery between workouts. °· Maintain physical fitness: °· Strength, flexibility, and endurance. °· Cardiovascular fitness. °· Learn and use proper technique. °· Wear properly fitted and padded protective equipment. °PROGNOSIS  °If treated properly, olecranon bursitis is usually curable within 2 weeks.  °RELATED COMPLICATIONS  °· Longer healing time, if not properly treated or if not given enough time to heal. °· Recurring symptoms that result in a chronic problem. °· Joint stiffness with permanent limitation of the affected joint's movement. °· Infection of the  bursa. °· Chronic inflammation or scarring of the bursa. °TREATMENT °Treatment first involves the use of ice and medicine, to reduce pain and inflammation. The use of strengthening and stretching exercises may help reduce pain with activity. These exercises may be performed at home or with a therapist. Elbow pads may be advised, to protect the bursa. If symptoms persist, despite non-surgical treatment, a procedure to withdraw fluid from the bursa may be advised. This procedure may be accompanied with an injection of corticosteroids, to reduce inflammation. Sometimes, surgery is needed to remove the bursa. °MEDICATION °· If pain medicine is needed, nonsteroidal anti-inflammatory medicines (aspirin and ibuprofen), or other minor pain relievers (acetaminophen), are often advised. °· Do not take pain medicine for 7 days before surgery. °· Prescription pain relievers may be given, if your caregiver thinks they are needed. Use only as directed and only as much as you need. °· Corticosteroid injections may be given by your caregiver. These injections should be reserved for the most serious cases, because they may only be given a certain number of times. °HEAT AND COLD °· Cold treatment (icing) should be applied for 10 to 15 minutes every 2 to 3 hours for inflammation and pain, and immediately after activity that aggravates your symptoms. Use ice packs or an ice massage. °· Heat treatment may be used before performing stretching and strengthening activities prescribed by your caregiver, physical therapist, or athletic trainer. Use a heat pack or a warm water soak. °SEEK IMMEDIATE MEDICAL CARE IF:  °· Symptoms get worse or do not improve in 2 weeks, despite treatment. °· Signs of infection develop, including fever of 102° F (38.9° C), increased pain, redness, warmth, or pus draining from   the bursa. °· New, unexplained symptoms develop. (Drugs used in treatment may produce side effects.) °EXERCISES  °RANGE OF MOTION (ROM) AND  STRETCHING EXERCISES - Olecranon Bursitis °These exercises may help you when beginning to rehabilitate your injury. Your symptoms may resolve with or without further involvement from your physician, physical therapist or athletic trainer. While completing these exercises, remember:  °· Restoring tissue flexibility helps normal motion to return to the joints. This allows healthier, less painful movement and activity. °· An effective stretch should be held for at least 30 seconds. °· A stretch should never be painful. You should only feel a gentle lengthening or release in the stretched tissue. °RANGE OF MOTION  Elbow Flexion, Supine °· Lie on your back. Extend your right / left arm into the air, bracing it with your opposite hand. Allow your right / left arm to relax. °· Let your elbow bend, allowing your hand to fall slowly toward your chest. °· You should feel a gentle stretch along the back of your upper arm and elbow. Your physician, physical therapist or athletic trainer may ask you to hold a __________ hand weight to increase the intensity of this stretch. °· Hold for __________ seconds. Slowly return your right / left arm to the upright position. °Repeat __________ times. Complete this exercise __________ times per day. °STRETCH  Elbow Flexors °· Lie on a firm bed or countertop on your back. Be sure that you are in a comfortable position which will allow you to relax your arm muscles. °· Place a folded towel under your right / left upper arm, so that your elbow and shoulder are at the same height. Extend your arm; your elbow should not rest on the bed or towel °· Allow the weight of your hand to straighten your elbow. Keep your arm and chest muscles relaxed. Your caregiver may ask you to increase the intensity of your stretch by adding a small wrist or hand weight. °· Hold for __________ seconds. You should feel a stretch on the inside of your elbow. Slowly return to the starting position. °Repeat __________  times. Complete this exercise __________ times per day. °STRENGTHENING EXERCISES - Olecranon Bursitis °These exercises will help you regain your strength. They may resolve your symptoms with or without further involvement from your physician, physical therapist or athletic trainer. While completing these exercises, remember:  °· Muscles can gain both the endurance and the strength needed for everyday activities through controlled exercises. °· Complete these exercises as instructed by your physician, physical therapist or athletic trainer. Increase the resistance and repetitions only as guided by your caregiver. °· You may experience muscle soreness or fatigue, but the pain or discomfort you are trying to eliminate should never worsen during these exercises. If this pain does worsen, stop and make certain you are following the directions exactly. If the pain is still present after adjustments, discontinue the exercise until you can discuss the trouble with your caregiver. °STRENGTH - Elbow Extensors, Isometric °· Stand or sit upright on a firm surface. Place your right / left arm so that your palm faces your stomach, and it is at the height of your waist. °· Place your opposite hand on the underside of your forearm. Gently push up as your right / left arm resists. Push as hard as you can with both arms without causing any pain or movement at your right / left elbow. Hold this stationary position for __________ seconds. °· Gradually release the tension in both arms. Allow   your muscles to relax completely before repeating. °Repeat __________ times. Complete this exercise __________ times per day. °STRENGTH - Elbow Flexors, Isometric °· Stand or sit upright on a firm surface. Place your right / left arm so that your hand is palm-up and at the height of your waist. °· Place your opposite hand on top of your forearm. Gently push down as your right / left arm resists. Push as hard as you can with both arms without causing  any pain or movement at your right / left elbow. Hold this stationary position for __________ seconds. °· Gradually release the tension in both arms. Allow your muscles to relax completely before repeating. °Repeat __________ times. Complete this exercise __________ times per day. °STRENGTH  Elbow Flexors, Supinated °· With good posture, stand or sit on a firm chair without armrests. Allow your right / left arm to rest at your side with your palm facing forward. °· Holding a __________ weight, or gripping a rubber exercise band or tubing, °· bring your hand toward your shoulder. °· Allow your muscles to control the resistance as your hand returns to your side. °Repeat __________ times. Complete this exercise __________ times per day.  °STRENGTH  Elbow Flexors, Neutral °· With good posture, stand or sit on a firm chair without armrests. Allow your right / left arm to rest at your side with your thumb facing forward. °· Holding a __________weight, or gripping a rubber exercise band or tubing, °· bring your hand toward your shoulder. °· Allow your muscles to control the resistance as your hand returns to your side. °Repeat __________ times. Complete this exercise __________ times per day.  °STRENGTH  Elbow Extensors °· Lie on your back. Extend your right / left elbow into the air, pointing it toward the ceiling. Brace your arm with your opposite hand.* °· Holding a __________ weight in your hand, slowly straighten your right / left elbow. °· Allow your muscles to control the weight as your hand returns to its starting position. °Repeat __________ times. Complete this exercise __________ times per day. °*You may also stand with your elbow overhead and pointed toward the ceiling, supported by your opposite hand. °STRENGTH - Elbow Extensors, Dynamic °· With good posture, stand, or sit on a firm chair without armrests. Keeping your upper arms at your side, bring both hands up to your right / left shoulder while gripping a  rubber exercise band or tubing. Your right / left hand should be just below the other hand. °· Straighten your right / left elbow. Hold for __________ seconds. °· Allow your muscles to control the rubber exercise band, as your hand returns to your shoulder. °Repeat __________ times. Complete this exercise __________ times per day. °Document Released: 12/31/2004 Document Revised: 03/25/2011 Document Reviewed: 04/14/2008 °[employer]® Patient Information ©2014 [employer]. ° °

## 2013-02-06 NOTE — Addendum Note (Signed)
Addended by: Wardell Honour on: 02/06/2013 12:15 PM   Modules accepted: Orders

## 2013-02-10 DIAGNOSIS — Z0271 Encounter for disability determination: Secondary | ICD-10-CM

## 2013-02-15 ENCOUNTER — Encounter: Payer: BC Managed Care – PPO | Admitting: Family Medicine

## 2013-02-17 ENCOUNTER — Ambulatory Visit
Admission: RE | Admit: 2013-02-17 | Discharge: 2013-02-17 | Disposition: A | Discharge status: Home / Self Care | Payer: BC Managed Care – PPO | Source: Ambulatory Visit | Attending: Family Medicine | Admitting: Family Medicine

## 2013-02-17 DIAGNOSIS — R7989 Other specified abnormal findings of blood chemistry: Secondary | ICD-10-CM

## 2013-02-17 DIAGNOSIS — R945 Abnormal results of liver function studies: Principal | ICD-10-CM

## 2013-03-03 ENCOUNTER — Telehealth: Payer: Self-pay

## 2013-03-03 NOTE — Telephone Encounter (Signed)
Dr Tamala Julian,  Patient is requesting pain medication.  She has an appointment with back specialist next month.  Can you write her enough to cover her?  She can come in to pick up the script.   [phone]

## 2013-03-04 NOTE — Telephone Encounter (Signed)
Pt states that she does not want to have to wait until Dr.Smith returns to have her rx fo pain medication refilled. Please advise. Best# [phone]

## 2013-03-05 NOTE — Telephone Encounter (Signed)
OK to refill oxycodone for patient as prescribed in 01/2013.  Please have PA approve medication and print for patient; I will send rx to PA pool.

## 2013-03-06 MED ORDER — OXYCODONE-ACETAMINOPHEN 5-325 MG PO TABS: 1.0000 | ORAL_TABLET | Freq: Four times a day (QID) | ORAL | Status: DC | PRN

## 2013-03-06 NOTE — Telephone Encounter (Signed)
Please advise patient.  Meds ordered this encounter  Medications  . oxyCODONE-acetaminophen (ROXICET) 5-325 MG per tablet    Sig: Take 1 tablet by mouth every 6 (six) hours as needed.    Dispense:  100 tablet    Refill:  0    Order Specific Question:  Supervising Provider    Answer:  DOOLITTLE, ROBERT P [[id number]]

## 2013-03-07 NOTE — Telephone Encounter (Signed)
Patient notified and voiced understanding.

## 2013-03-30 ENCOUNTER — Ambulatory Visit (INDEPENDENT_AMBULATORY_CARE_PROVIDER_SITE_OTHER): Payer: BC Managed Care – PPO | Admitting: Family Medicine

## 2013-03-30 ENCOUNTER — Encounter: Payer: Self-pay | Admitting: Family Medicine

## 2013-03-30 VITALS — BP 157/99 | HR 93 | Temp 98.6°F | Resp 16 | Ht 64.5 in | Wt 216.0 lb

## 2013-03-30 DIAGNOSIS — R945 Abnormal results of liver function studies: Secondary | ICD-10-CM

## 2013-03-30 DIAGNOSIS — R7989 Other specified abnormal findings of blood chemistry: Secondary | ICD-10-CM

## 2013-03-30 DIAGNOSIS — K7689 Other specified diseases of liver: Secondary | ICD-10-CM

## 2013-03-30 DIAGNOSIS — M2550 Pain in unspecified joint: Secondary | ICD-10-CM

## 2013-03-30 DIAGNOSIS — M5030 Other cervical disc degeneration, unspecified cervical region: Secondary | ICD-10-CM

## 2013-03-30 DIAGNOSIS — Z23 Encounter for immunization: Secondary | ICD-10-CM

## 2013-03-30 DIAGNOSIS — E7800 Pure hypercholesterolemia, unspecified: Secondary | ICD-10-CM

## 2013-03-30 DIAGNOSIS — K760 Fatty (change of) liver, not elsewhere classified: Secondary | ICD-10-CM | POA: Insufficient documentation

## 2013-03-30 DIAGNOSIS — M5136 Other intervertebral disc degeneration, lumbar region: Secondary | ICD-10-CM

## 2013-03-30 DIAGNOSIS — G894 Chronic pain syndrome: Secondary | ICD-10-CM

## 2013-03-30 DIAGNOSIS — M5137 Other intervertebral disc degeneration, lumbosacral region: Secondary | ICD-10-CM

## 2013-03-30 LAB — COMPLETE METABOLIC PANEL WITH GFR
ALT: 80 U/L — ABNORMAL HIGH (ref 0–35)
AST: 47 U/L — ABNORMAL HIGH (ref 0–37)
Albumin: 4.1 g/dL (ref 3.5–5.2)
Alkaline Phosphatase: 78 U/L (ref 39–117)
BUN: 14 mg/dL (ref 6–23)
CALCIUM: 9 mg/dL (ref 8.4–10.5)
CHLORIDE: 103 meq/L (ref 96–112)
CO2: 30 meq/L (ref 19–32)
Creat: 0.74 mg/dL (ref 0.50–1.10)
GFR, Est Non African American: >89 mL/min
Glucose, Bld: 114 mg/dL — ABNORMAL HIGH (ref 70–99)
Potassium: 4.5 mEq/L (ref 3.5–5.3)
Sodium: 140 mEq/L (ref 135–145)
Total Bilirubin: 0.3 mg/dL (ref 0.2–1.2)
Total Protein: 7 g/dL (ref 6.0–8.3)

## 2013-03-30 MED ORDER — OXYCODONE-ACETAMINOPHEN 5-325 MG PO TABS: 1.0000 | ORAL_TABLET | Freq: Three times a day (TID) | ORAL | Status: DC | PRN

## 2013-03-30 MED ORDER — OXYCODONE-ACETAMINOPHEN 5-325 MG PO TABS: 1.0000 | ORAL_TABLET | Freq: Three times a day (TID) | ORAL | Status: AC | PRN

## 2013-03-30 MED ORDER — ALPRAZOLAM 1 MG PO TABS: 1.0000 mg | ORAL_TABLET | Freq: Three times a day (TID) | ORAL | Status: AC | PRN

## 2013-03-30 MED ORDER — NORTRIPTYLINE HCL 25 MG PO CAPS: ORAL_CAPSULE | ORAL | Status: AC
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
## 2013-04-04 MED ORDER — ATORVASTATIN CALCIUM 10 MG PO TABS: 10.0000 mg | ORAL_TABLET | Freq: Every day | ORAL | Status: AC

## 2013-04-04 NOTE — Addendum Note (Signed)
Addended by: Wardell Honour on: 04/04/2013 11:40 AM   Modules accepted: Orders

## 2013-04-14 ENCOUNTER — Telehealth: Payer: Self-pay

## 2013-04-14 NOTE — Telephone Encounter (Signed)
Amy, RN from [facility] left voicemail requesting ultrasound sent to their office. Patient referred by Dr Tamala Julian. Ultrasound (abdomen) faxed with confirmation to Amy's attn to [phone].

## 2013-06-14 DEATH — deceased

## 2013-07-05 ENCOUNTER — Ambulatory Visit: Payer: BC Managed Care – PPO | Admitting: Family Medicine

## 2013-11-15 NOTE — Progress Notes (Signed)
This encounter was created in error - please disregard.

## 2014-07-14 IMAGING — CR DG ELBOW COMPLETE 3+V*R*
3 series · 3 of 3 positions shown · non-contrast
Comparison: None.

CLINICAL DATA: Right elbow pain.  Swelling.

EXAM:
RIGHT ELBOW - COMPLETE 3+ VIEW

[AP]
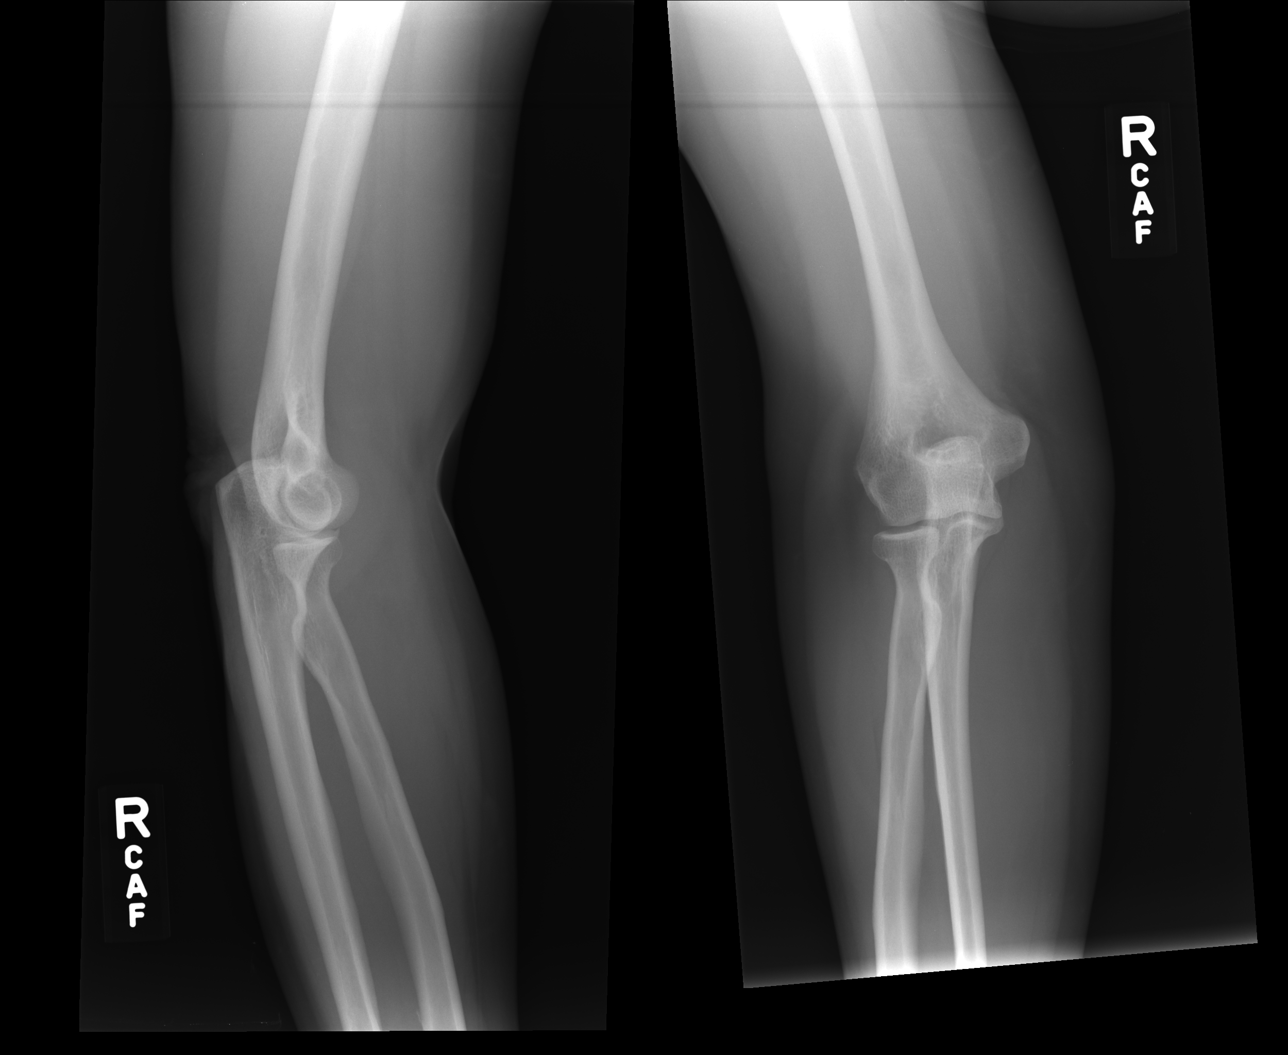

[lateral]
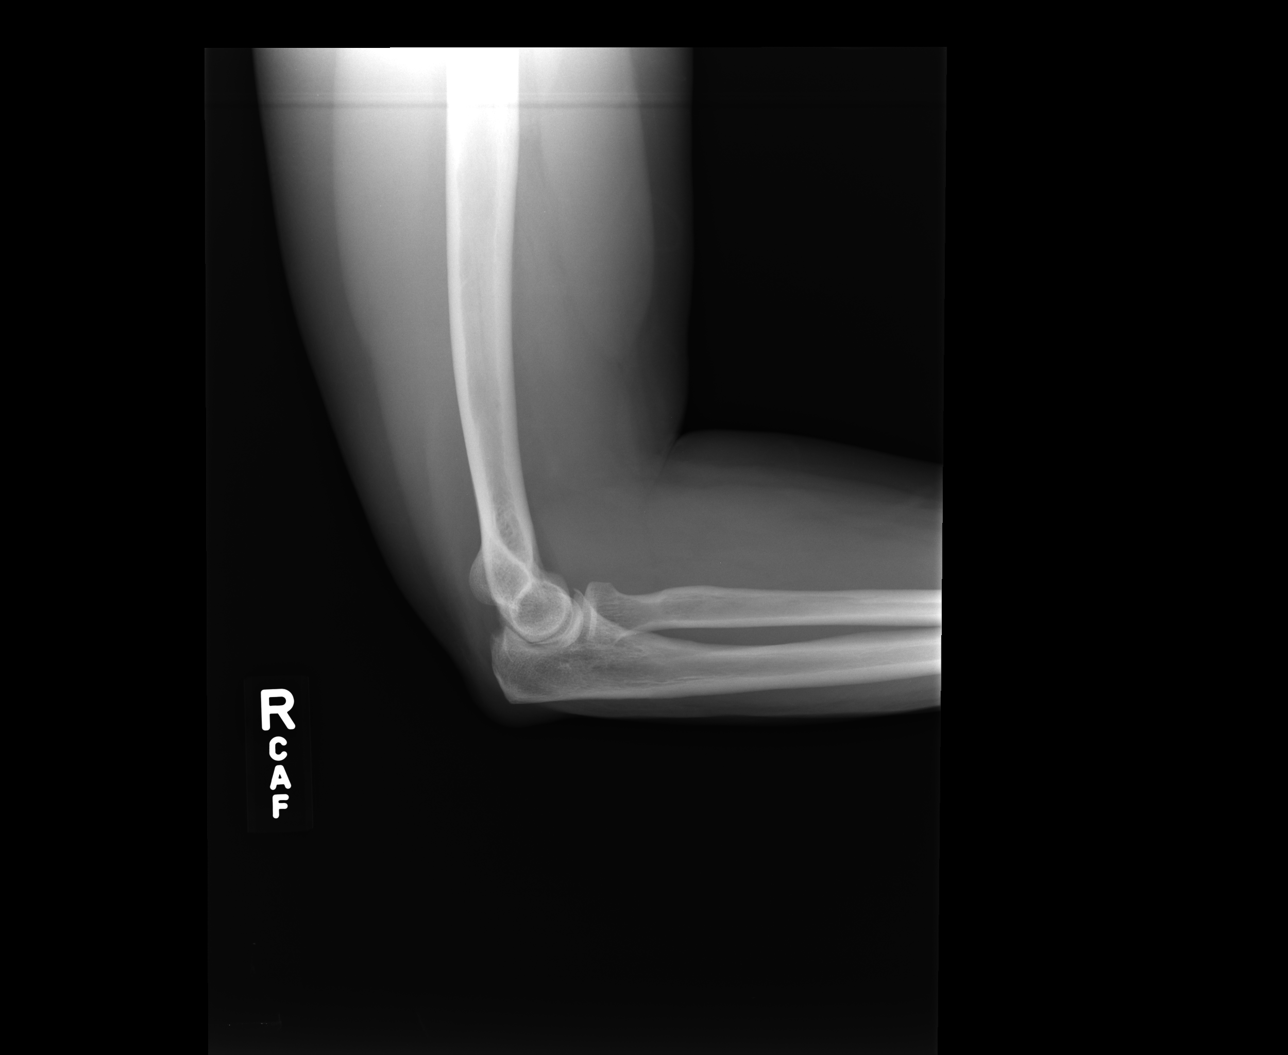

[ap obl ext rot]
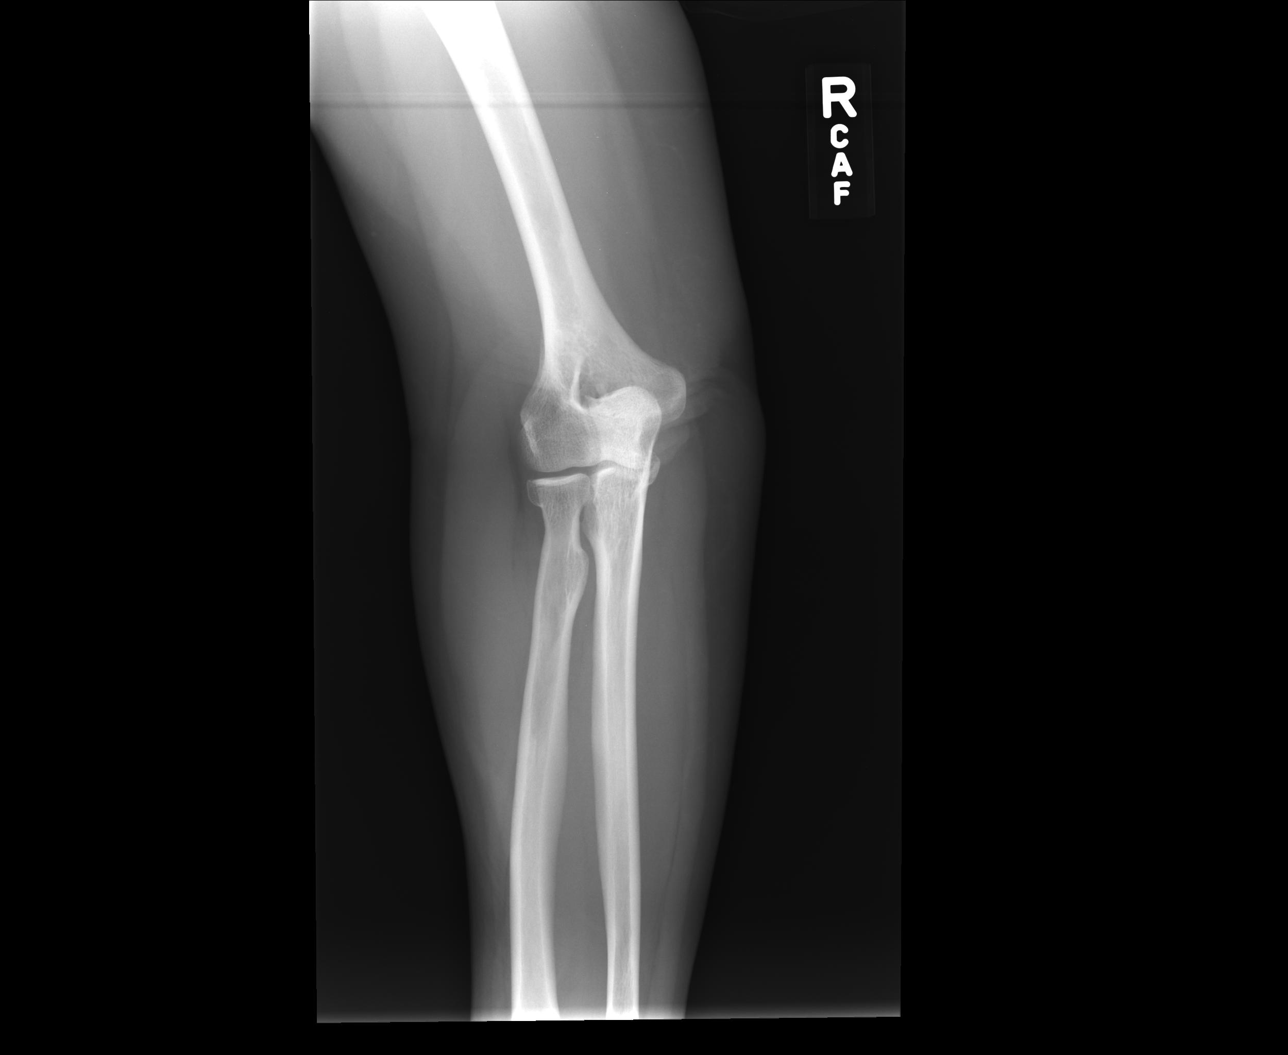

[3 of 3 positions shown; findings below may reference images not displayed]

FINDINGS: The right elbow is located. There is no evidence for a displaced
fracture. There is subtle lucency along the posterior aspect of the
distal humerus on the lateral view but this may be related to
overlying tissue. There is no clear evidence for an elbow joint
effusion. Small osteophyte at the coronoid process of the ulna.
IMPRESSION: Mild degenerative changes in the right elbow. No acute bone
abnormality.

## 2014-07-31 IMAGING — US US ABDOMEN COMPLETE
1 series · 14 of 25 positions shown · non-contrast
Comparison: None.

CLINICAL DATA: Elevated liver function tests.

EXAM:
ULTRASOUND ABDOMEN COMPLETE

[Series 1: us abdomen complete · 0.37mm/px · 14 of 82 slices shown]
[im 1/82]
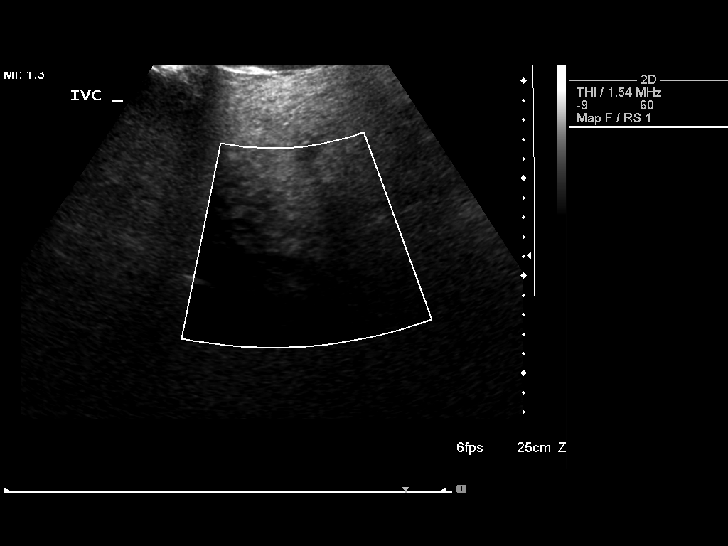
[im 7/82]
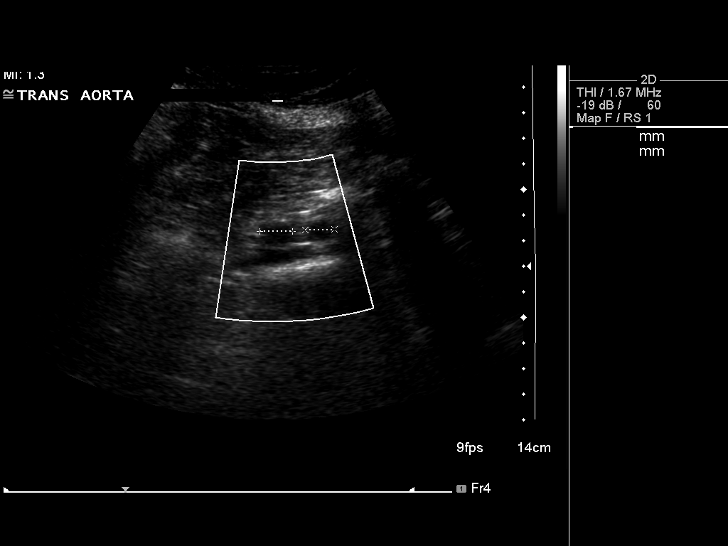
[im 14/82]
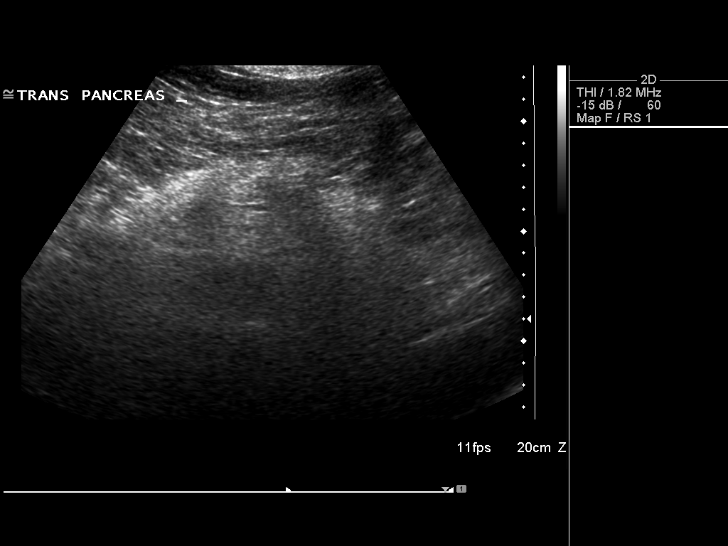
[im 21/82]
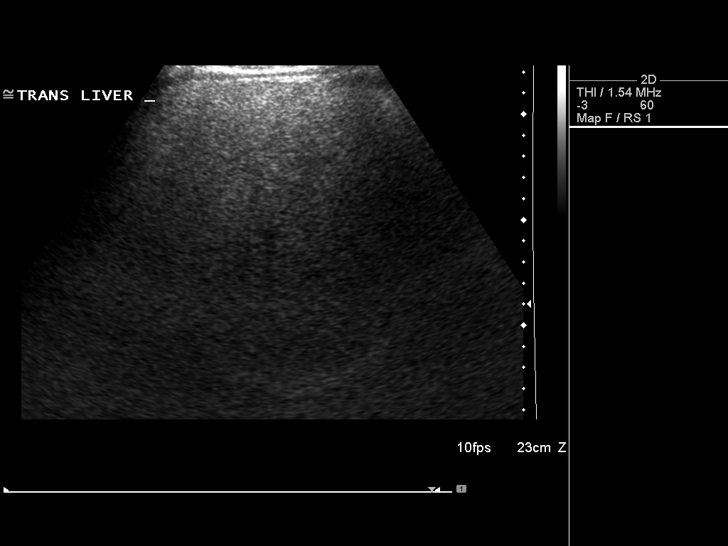
[im 28/82]
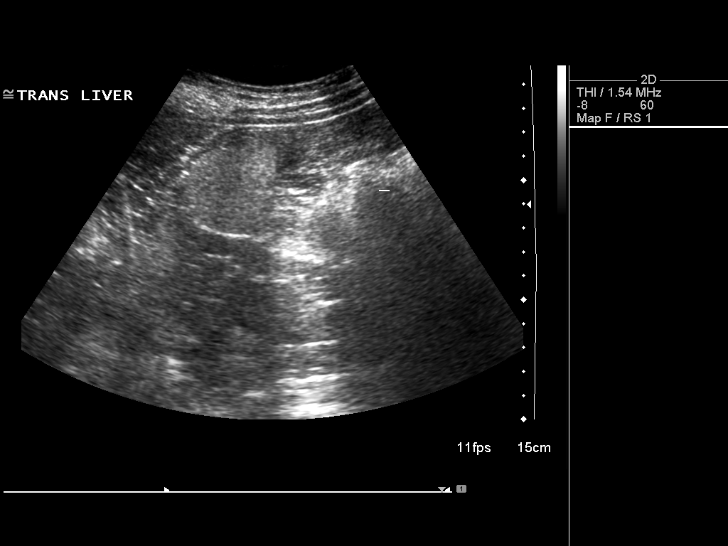
[im 31/82]
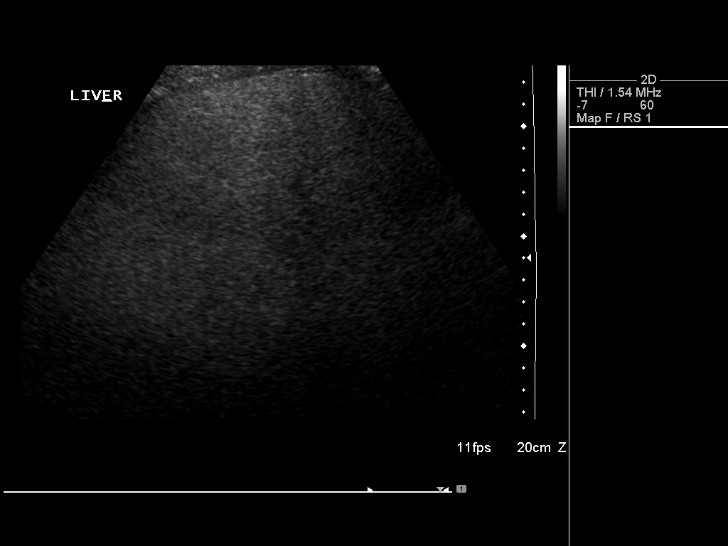
[im 38/82]
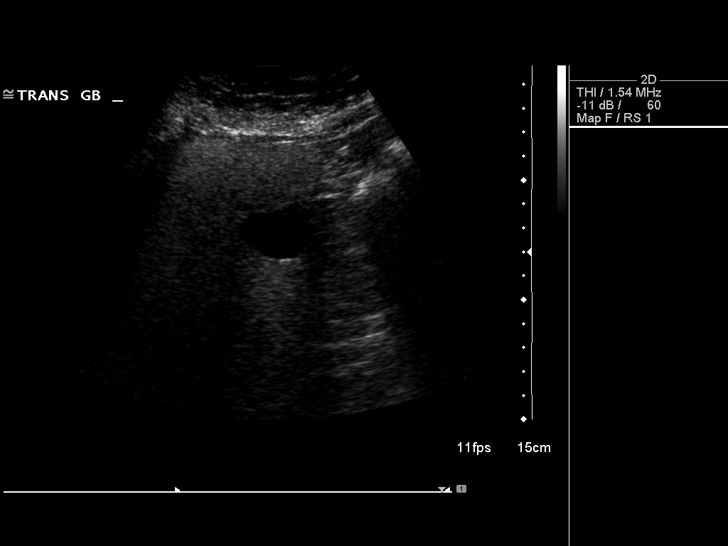
[im 44/82]
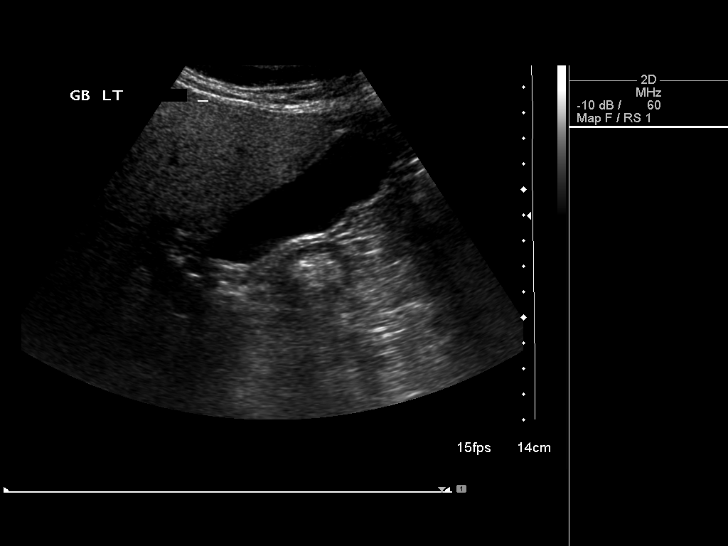
[im 51/82]
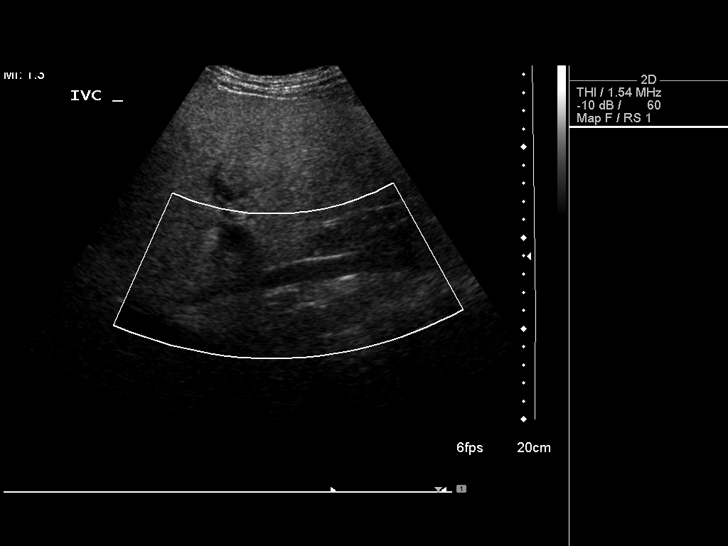
[im 55/82]
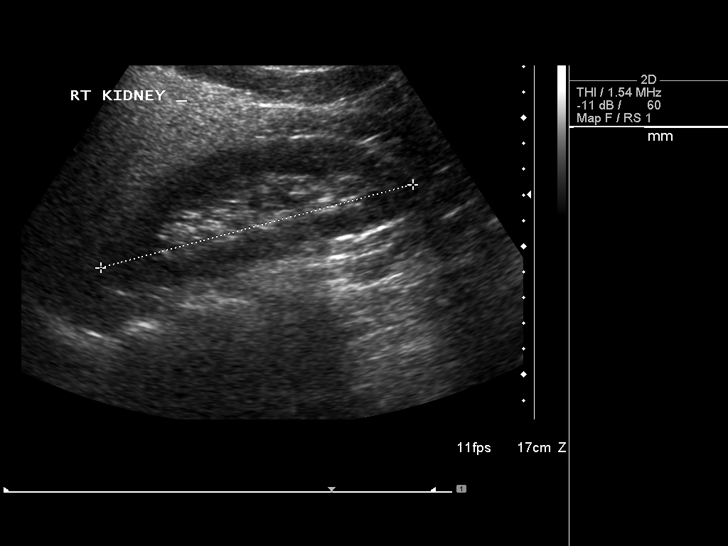
[im 61/82]
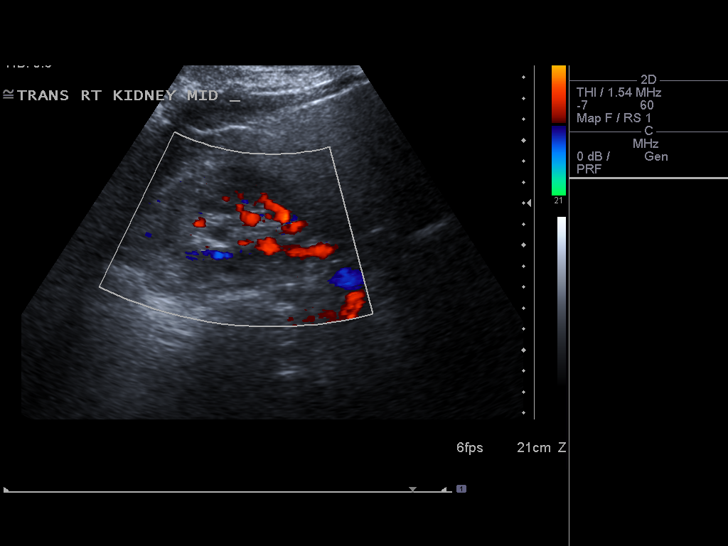
[im 68/82]
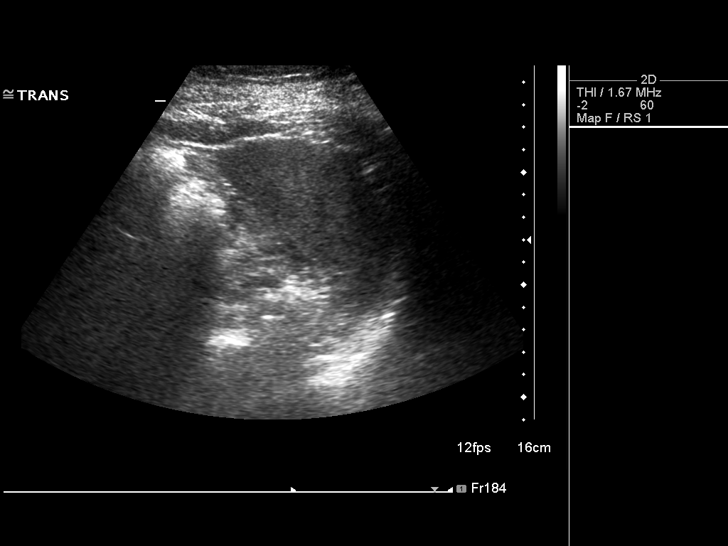
[im 75/82]
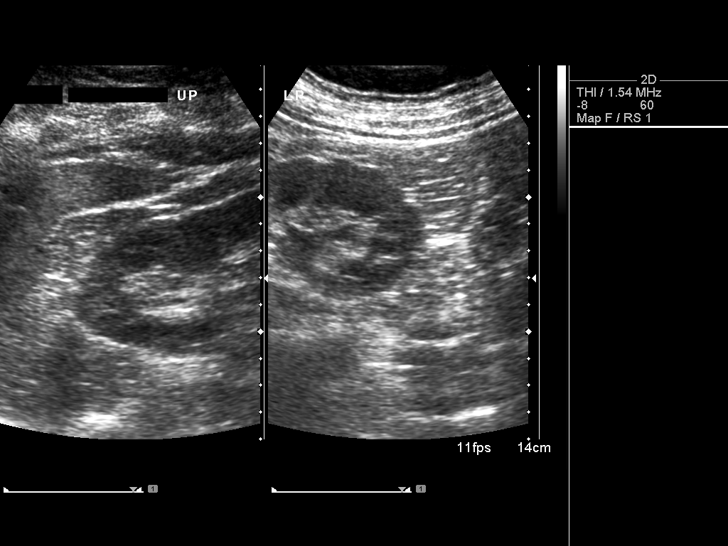
[im 82/82]
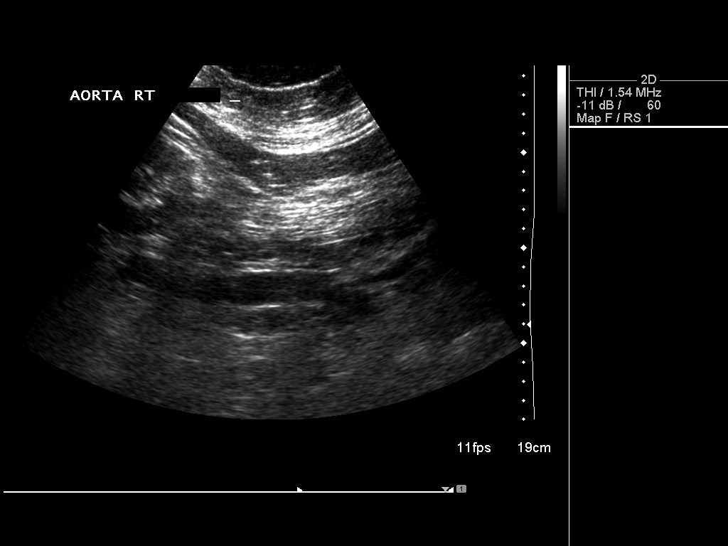

[14 of 25 positions shown; findings below may reference images not displayed]

FINDINGS: Gallbladder:

No gallstones or wall thickening visualized. No sonographic Murphy
sign noted.

Common bile duct:

Diameter: Normal, 5 mm.

Liver:

Enlarged, 19.6 cm. Increased echogenicity. Relative hypoechogenicity
adjacent to the gallbladder.

IVC:

No abnormality visualized.

Pancreas:

Visualized portion unremarkable.

Spleen:

Size and appearance within normal limits.

Right Kidney:

Length: 12.6 cm.. Echogenicity within normal limits. No mass or
hydronephrosis visualized.

Left Kidney:

Length: 11.5 cm.. Echogenicity within normal limits. No mass or
hydronephrosis visualized.

Abdominal aorta:

No aneurysm visualized.

Other findings:

None.
IMPRESSION: 1. Hepatomegaly with hepatic steatosis. Relative sparing adjacent
the gallbladder.
2. No biliary ductal dilatation or other explanation for elevated
liver function tests.
